# Patient Record
Sex: Male | Born: 2000 | Race: White | Hispanic: No | Marital: Single | Smoking: Current every day smoker
Health system: Southern US, Community
[De-identification: ages and names within clinical notes are randomized; demographics above are authoritative.]

---
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
## 2021-06-03 ENCOUNTER — Other Ambulatory Visit (INDEPENDENT_AMBULATORY_CARE_PROVIDER_SITE_OTHER): Payer: BC Managed Care – PPO

## 2021-06-03 ENCOUNTER — Encounter: Payer: Self-pay | Admitting: Family Medicine

## 2021-06-03 ENCOUNTER — Other Ambulatory Visit: Payer: Self-pay

## 2021-06-03 ENCOUNTER — Telehealth: Payer: BC Managed Care – PPO | Admitting: Family Medicine

## 2021-06-03 VITALS — Temp 100.3°F | Ht 74.0 in | Wt 155.0 lb

## 2021-06-03 DIAGNOSIS — R509 Fever, unspecified: Secondary | ICD-10-CM

## 2021-06-03 DIAGNOSIS — R1110 Vomiting, unspecified: Secondary | ICD-10-CM

## 2021-06-03 DIAGNOSIS — R197 Diarrhea, unspecified: Secondary | ICD-10-CM | POA: Diagnosis not present

## 2021-06-03 DIAGNOSIS — R630 Anorexia: Secondary | ICD-10-CM | POA: Diagnosis not present

## 2021-06-03 DIAGNOSIS — R109 Unspecified abdominal pain: Secondary | ICD-10-CM | POA: Diagnosis not present

## 2021-06-03 DIAGNOSIS — R519 Headache, unspecified: Secondary | ICD-10-CM

## 2021-06-03 LAB — POC COVID19 BINAXNOW: SARS Coronavirus 2 Ag: NEGATIVE

## 2021-06-03 LAB — POCT INFLUENZA A/B
Influenza A, POC: NEGATIVE
Influenza B, POC: NEGATIVE

## 2021-06-03 NOTE — Progress Notes (Signed)
Start time: 1:30 End time: 1:50  Virtual Visit via Video Note  I connected with Ronald Frey on 06/03/21 by a video enabled telemedicine application and verified that I am speaking with the correct person using two identifiers.  Location: Patient: home Provider: office   I discussed the limitations of evaluation and management by telemedicine and the availability of in person appointments. The patient expressed understanding and agreed to proceed.  History of Present Illness:  Chief Complaint  Patient presents with   Fever    VIRTUAL fever, HA, stomach, loss of appetite and diarrhea. Symptoms started Sunday night and worsened Monday.   12/4 he started with headache, thought it could be his sinuses.  He took 2 tylenol.  The next day he woke up with stomach cramps, fever of 101.1. He had diarrhea all that day, and vomited just once in the morning. Tues/Wed fever remained low grade, around 100.  Stomach cramps have improved, frequency of loose stools has decreased (2x/Tues, once today).  No mucus or blood in the stool. No further vomiting.  Some nausea intermittently.  Drinking fluids.  Cut back on eating due to decreased appetite.  Ongoing HA's, needing to take tylenol, which helps. Mouth is dry.  Denies runny nose, sore throat, postnasal drip, cough.  No sick contacts at home. +sick contacts at work ("flu"; no COVID that he is aware of) No travel, raw foods, camping, no recent ABX.  +vapes--trying to cut back  PMH, PSH, SH reviewed  No COVID or flu vaccines Had COVID 2x, last was 3-4 months ago.  Never treated with any medications  Meds: tylenol only   No Known Allergies  ROS: fever, HA and GI complaint per HPI. No URI symptoms, no urinary symptoms. No chest pain, shortness of breath, palpitations No rash. No blood or mucus in the stool    Observations/Objective:  Temp 100.3 F (37.9 C) (Tympanic)   Ht 6\' 2"  (1.88 m)   Wt 155 lb (70.3 kg)   BMI 19.90 kg/m    Well-appearing, pleasant male in no distress He is alert and oriented. Cranial nerves grossly intact. Normal speech Exam is limited due to virtual nature of the visit   Negative COVID and flu tests  Assessment and Plan:  Vomiting and diarrhea - suspect viral AGE. Reviewed supportive measures and red flags for concern.  Stomach cramps - overall improving   Fever, unspecified fever cause - negative COVID test. Poss related to fever, dehydration. Cont tylenol prn  Stay well hydrated Avoid dairy products for another 3-5 days. BRAT diet (bananas, rice, applesauce and toast), bland diet (avoid spicy, greasy fried foods). Taking probiotic daily may help. Return if you develop persistent fever, worsening abdominal pain, blood or mucus in the stool, recurrent vomiting, ongoing diarrhea, or any other concerns.  I hope you feel better soon!  Needs CPE with  Follow Up Instructions:    I discussed the assessment and treatment plan with the patient. The patient was provided an opportunity to ask questions and all were answered. The patient agreed with the plan and demonstrated an understanding of the instructions.   The patient was advised to call back or seek an in-person evaluation if the symptoms worsen or if the condition fails to improve as anticipated.  I spent 23 minutes dedicated to the care of this patient, including pre-visit review of records, face to face time, post-visit ordering of testing and documentation.    Vincenza Hews, MD

## 2021-06-03 NOTE — Patient Instructions (Signed)
Stay well hydrated Avoid dairy products for another 3-5 days. BRAT diet (bananas, rice, applesauce and toast), bland diet (avoid spicy, greasy fried foods). Taking probiotic daily may help. Return if you develop persistent fever, worsening abdominal pain, blood or mucus in the stool, recurrent vomiting, ongoing diarrhea, or any other concerns.  I hope you feel better soon!

## 2021-06-04 LAB — NOVEL CORONAVIRUS, NAA: SARS-CoV-2, NAA: NOT DETECTED

## 2021-11-09 ENCOUNTER — Ambulatory Visit (INDEPENDENT_AMBULATORY_CARE_PROVIDER_SITE_OTHER): Payer: BC Managed Care – PPO | Admitting: Medical

## 2021-11-09 VITALS — BP 110/70 | HR 57 | Temp 98.6°F | Wt 160.4 lb

## 2021-11-09 DIAGNOSIS — L089 Local infection of the skin and subcutaneous tissue, unspecified: Secondary | ICD-10-CM

## 2021-11-09 DIAGNOSIS — S80861A Insect bite (nonvenomous), right lower leg, initial encounter: Secondary | ICD-10-CM

## 2021-11-09 DIAGNOSIS — W57XXXA Bitten or stung by nonvenomous insect and other nonvenomous arthropods, initial encounter: Secondary | ICD-10-CM

## 2021-11-09 MED ORDER — CEPHALEXIN 500 MG PO CAPS: 500.0000 mg | ORAL_CAPSULE | Freq: Three times a day (TID) | ORAL | 0 refills | Status: DC

## 2021-11-09 NOTE — Progress Notes (Signed)
Subjective: ? Ronald Frey is a 21 y.o. male who presents for ?Chief Complaint  ?Patient presents with  ? bite  ?  Bite on right leg red and swollen.   ?   ?Here for localized redness and tendnerss of right lower leg.  Thinks he got bit by something in his sleep.  He was outside fishing 2 days ago, but just realized he had a red tender mark on his leg today.  No fever, no NVD, no chills.  No contacts with skin infection. No other aggravating or relieving factors.   ? ?No other c/o. ? ?The following portions of the patient's history were reviewed and updated as appropriate: allergies, current medications, past family history, past medical history, past social history, past surgical history and problem list. ? ?ROS ?Otherwise as in subjective above ? ?Objective: ?BP 110/70   Pulse (!) 57   Temp 98.6 ?F (37 ?C)   Wt 160 lb 6.4 oz (72.8 kg)   BMI 20.59 kg/m?  ? ?General appearance: alert, no distress, well developed, well nourished ?Skin: right lower leg medially with 2cm round area of erythema, with 2 small round lesions possible insect bite markings, otherwise slight warmth without fluctuance or induration ?Tender to touch at area above only. Rest of leg nontender and normal ROM ?No ext edema ?Legs neurovascularly intact ? ? ?Assessment: ?Encounter Diagnoses  ?Name Primary?  ? Skin infection Yes  ? Insect bite of right lower leg, initial encounter   ? ? ? ?Plan: ?Discussed symptoms and concerns.  Likely insect bite, but probably not black widow bite given lack of other symptoms. ? ?Discussed recommendations below ? ? ?Patient Instructions  ?Recommendations: ?Elevated the leg when possible over the next 3 days ?Use hot compresses 20 minutes at a time ?You can use Tylenol or Ibuprofen for pain every 6 hours as needed for the next few days ?Begin Keflex oral antibiotic x 7-10 days ?Keep the area clean with soap and water ?If much worse in the next 2-3 days, then come back for recheck ? ? ? ? ?Ronald Frey was seen  today for bite. ? ?Diagnoses and all orders for this visit: ? ?Skin infection ? ?Insect bite of right lower leg, initial encounter ? ? ? ?Follow up: prn ? ?

## 2021-11-09 NOTE — Patient Instructions (Signed)
Recommendations: ?Elevated the leg when possible over the next 3 days ?Use hot compresses 20 minutes at a time ?You can use Tylenol or Ibuprofen for pain every 6 hours as needed for the next few days ?Begin Keflex oral antibiotic x 7-10 days ?Keep the area clean with soap and water ?If much worse in the next 2-3 days, then come back for recheck ? ? ? ? ?

## 2021-11-10 ENCOUNTER — Other Ambulatory Visit: Payer: Self-pay | Admitting: Medical

## 2021-11-10 ENCOUNTER — Telehealth: Payer: Self-pay

## 2021-11-10 MED ORDER — SULFAMETHOXAZOLE-TRIMETHOPRIM 800-160 MG PO TABS: 1.0000 | ORAL_TABLET | Freq: Two times a day (BID) | ORAL | 0 refills | Status: DC

## 2021-11-10 MED ORDER — HYDROCODONE-ACETAMINOPHEN 10-325 MG PO TABS: 1.0000 | ORAL_TABLET | Freq: Four times a day (QID) | ORAL | 0 refills | Status: DC | PRN

## 2021-11-10 NOTE — Telephone Encounter (Signed)
Started antibiotics, pain was better this morning, but now shooting pain, can't hardly walk on it, redness worse, dark red part same, light red spreading all around leg, down back of calf.  Discussed with Vincenza Hews and he wants pt to continue antibiotic and adding another antibiotic and pain medication. Stay off leg, rest & elevate. Ok note for out of work. Warm compresses & elevate.  Told pt to watch for black or grey area in center if occurs call office to come in immediately and also come back in Thursday for recheck.  Pt informed of all this, Appt made, note for work.

## 2021-11-12 ENCOUNTER — Ambulatory Visit: Payer: BC Managed Care – PPO | Admitting: Medical

## 2021-11-12 VITALS — BP 110/70 | HR 61 | Temp 98.0°F | Wt 154.6 lb

## 2021-11-12 DIAGNOSIS — L02415 Cutaneous abscess of right lower limb: Secondary | ICD-10-CM | POA: Diagnosis not present

## 2021-11-12 NOTE — Progress Notes (Signed)
Subjective:  Ronald Frey is a 21 y.o. male who presents for Chief Complaint  Patient presents with   Insect Bite    Bite has gotten worse and raised more and swelling down leg and redness     Here for recheck.   I saw him on 11/09/21 for possible spider bite and skin infection.  Despite starting Keflex and Bactrim the area has gotten worse.  More swollen more tender.  No fever.  No drainage. No other aggravating or relieving factors.    No other c/o.  The following portions of the patient's history were reviewed and updated as appropriate: allergies, current medications, past family history, past medical history, past social history, past surgical history and problem list.  ROS Otherwise as in subjective above  Objective: BP 110/70   Pulse 61   Temp 98 F (36.7 C)   Wt 154 lb 9.6 oz (70.1 kg)   BMI 19.85 kg/m   General appearance: alert, no distress, well developed, well nourished Skin: right lower leg medially with 4cm round area of erythema, raised tender area with slight induration, possible fluctuance, slight warmth, central crusted scab No ext edema Legs neurovascularly intact   Assessment: Encounter Diagnosis  Name Primary?   Cutaneous abscess of right lower extremity Yes      Plan: Discussed examination findings, diagnosis, usual course of illness, and options for therapy discussed. After discussing recommendations, patient agrees to I&D, oral antibiotics.    Procedure Informed consent obtained.  The area was prepped in the usual manner and the skin overlying the abscess was anesthetized with 2.5cc of 1% lidocaine with epinephrine.  The area was sharply incised and approx 3ccs of purulent material was obtained.  Area was irrigated with high pressure saline. Packing was inserted. Wound was covered with sterile bandage.    Advised patient to complete the course of oral antibiotics, use warm compresses or heat applied to the area to promote drainage.  3 days from  now will be Sunday.  He will yank the packing out on Sunday.  Finish out course of antibiotics, await culture.  As long as it is improving daily then plan to follow-up on 7 to 10 days  However, if worse signs of infections as discussed (fever, chills, nausea, vomiting, worsening redness, worsening pain), then call or return immediately.    There are no Patient Instructions on file for this visit. [name] was seen today for insect bite.  Diagnoses and all orders for this visit:  Cutaneous abscess of right lower extremity -     WOUND CULTURE   Follow up: 7-10 days

## 2021-11-16 LAB — WOUND CULTURE

## 2021-12-28 ENCOUNTER — Encounter: Payer: Self-pay | Admitting: Physician Assistant

## 2021-12-28 ENCOUNTER — Ambulatory Visit: Payer: BC Managed Care – PPO | Admitting: Physician Assistant

## 2021-12-28 VITALS — BP 110/60 | HR 72 | Ht 74.0 in | Wt 152.2 lb

## 2021-12-28 DIAGNOSIS — L03818 Cellulitis of other sites: Secondary | ICD-10-CM

## 2021-12-28 DIAGNOSIS — Z22322 Carrier or suspected carrier of Methicillin resistant Staphylococcus aureus: Secondary | ICD-10-CM

## 2021-12-28 DIAGNOSIS — Z113 Encounter for screening for infections with a predominantly sexual mode of transmission: Secondary | ICD-10-CM

## 2021-12-28 MED ORDER — CEFDINIR 300 MG PO CAPS: 300.0000 mg | ORAL_CAPSULE | Freq: Two times a day (BID) | ORAL | 0 refills | Status: DC

## 2021-12-28 NOTE — Progress Notes (Signed)
Acute Office Visit  Subjective:    Patient ID: Ronald Frey, male    DOB: Oct 19, 2000, 21 y.o.   MRN: [id number]  Chief Complaint  Patient presents with   Acute Visit    Bump on penis that's painful. He has had it since Friday.    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 (Patient not taking: Reported on 12/28/2021) 30 capsule 0   HYDROcodone-acetaminophen (NORCO) 10-325 MG tablet Take 1 tablet by mouth every 6 (six) hours as needed. 1/2-1 tablet po prn q6 hours prn pain (Patient not taking: Reported on 12/28/2021) 15 tablet 0   sulfamethoxazole-trimethoprim (BACTRIM DS) 800-160 MG tablet Take 1 tablet by mouth 2 (two) times daily. (Patient not taking: Reported on 12/28/2021) 14 tablet 0   No facility-administered medications prior to visit.    No Known Allergies  Review of Systems  Constitutional:  Negative for activity change, chills, fatigue and fever.  HENT:  Negative for congestion, ear pain, hearing loss and voice change.   Eyes:  Negative for pain and redness.  Respiratory:  Negative for cough and shortness of breath.   Cardiovascular:  Negative for leg swelling.  Gastrointestinal:  Negative for constipation, diarrhea, nausea and vomiting.  Endocrine: Negative for polyuria.  Genitourinary:  Positive for genital sores. Negative for difficulty urinating, dysuria, flank pain, frequency, penile  discharge, penile swelling, scrotal swelling and testicular pain.  Musculoskeletal:  Negative for joint swelling and neck pain.  Skin:  Negative for rash.  Neurological:  Negative for dizziness.  Hematological:  Does not bruise/bleed easily.  Psychiatric/Behavioral:  Negative for agitation and behavioral problems.        Objective:    Physical Exam Vitals and nursing note reviewed. Exam conducted with a chaperone present.  Constitutional:      General: He is not in acute distress.    Appearance: Normal appearance.  HENT:     Head: Normocephalic and atraumatic.     Right Ear: External ear normal.     Left Ear: External ear normal.     Nose: No congestion.  Eyes:     Extraocular Movements: Extraocular movements intact.     Conjunctiva/sclera: Conjunctivae normal.     Pupils: Pupils are equal, round, and reactive to light.  Cardiovascular:     Rate and Rhythm: Normal rate and regular rhythm.     Pulses: Normal pulses.     Heart sounds: Normal heart sounds.  Pulmonary:     Effort: Pulmonary effort is normal.     Breath sounds: Normal breath sounds. No wheezing.  Abdominal:     General: Bowel sounds are normal.     Palpations: Abdomen is soft.  Genitourinary:      Comments: Pustule with green/yellow discharge with surrounding erythema, no edema, no contusion Musculoskeletal:        General: Normal range of motion.     Cervical  back: Normal range of motion and neck supple.     Right lower leg: No edema.     Left lower leg: No edema.  Skin:    General: Skin is warm and dry.     Findings: No rash.  Neurological:     Mental Status: He is alert and oriented to person, place, and time.     Gait: Gait normal.  Psychiatric:        Mood and Affect: Mood normal.        Behavior: Behavior normal.     BP 110/60   Pulse 72   Wt 152 lb 3.2 oz (69 kg)   SpO2 97%   BMI 19.54 kg/m   Wt Readings from Last 3 Encounters:  12/28/21 152 lb 3.2 oz (69 kg)  11/12/21 154 lb 9.6 oz  (70.1 kg)  11/09/21 160 lb 6.4 oz (72.8 kg)    Results for orders placed or performed in visit on 11/12/21  WOUND CULTURE   Specimen: Wound   LG  Result Value Ref Range   Gram Stain Result Final report    Organism ID, Bacteria Comment    Organism ID, Bacteria Comment    Aerobic Bacterial Culture Final report (A)    Organism ID, Bacteria Comment (A)    Antimicrobial Susceptibility Comment        Assessment & Plan:  1. Cellulitis of other specified site - WOUND CULTURE - Herpes simplex virus culture - Chlamydia/GC NAA, Confirmation  2. 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   - declines labs to check for HIV, HEP C, syphilis   Meds ordered this encounter  Medications   cefdinir (OMNICEF) 300 MG capsule    Sig: Take 1 capsule (300 mg total) by mouth 2 (two) times daily.    Dispense:  20 capsule    Refill:  0    Order Specific Question:   Supervising Provider    Answer:   Ronnald Nian [[id number]]    Return for Return as Already Scheduled.  Jake Shark, PA-C

## 2021-12-28 NOTE — Patient Instructions (Signed)
Rest, increase clear fluids, OTC Tylenol (acetamenophen) for body aches, headaches, fever, chills as needed.  ? ?

## 2021-12-30 LAB — CHLAMYDIA/GC NAA, CONFIRMATION
Chlamydia trachomatis, NAA: NEGATIVE
Neisseria gonorrhoeae, NAA: NEGATIVE

## 2021-12-31 LAB — WOUND CULTURE: Organism ID, Bacteria: NONE SEEN

## 2021-12-31 LAB — HERPES SIMPLEX VIRUS CULTURE

## 2021-12-31 MED ORDER — SULFAMETHOXAZOLE-TRIMETHOPRIM 800-160 MG PO TABS: 1.0000 | ORAL_TABLET | Freq: Two times a day (BID) | ORAL | 0 refills | Status: AC

## 2021-12-31 NOTE — Addendum Note (Signed)
Addended by: Burnard Hawthorne on: 12/31/2021 09:02 AM   Modules accepted: Orders

## 2021-12-31 NOTE — Progress Notes (Signed)
Please call patient to let him know that he DOES NOT have any STIs: Gonorrhea, Chlamydia, and Herpes all NEGATIVE  He does have a skin infection, cellulitis, caused by the MRSA bacteria. I will order 1 more round of antibiotics to make sure that the infection completely heals, Bactrim DS, 1 by mouth every 12 hours with food x 14 days.  Methicillin-resistant Staphylococcus aureus (MRSA) infection is caused by bacteria called Staphylococcus aureus, or staph. MRSA infection can be hard to treat.  This condition is caused by staph bacteria. Illness may develop after exposure to the bacteria through:  Skin-to-skin contact with someone who is infected with MRSA.  Touching surfaces that have the bacteria on them.  Having a procedure or using equipment that allows MRSA to enter the body.  Having MRSA that lives on your skin and then enters your body through:  A cut or scratch.  A surgery or procedure.  The use of a medical device.

## 2022-02-26 ENCOUNTER — Ambulatory Visit: Payer: 59 | Admitting: Medical

## 2022-02-26 VITALS — BP 106/72 | HR 78 | Temp 97.9°F | Ht 73.0 in | Wt 146.2 lb

## 2022-02-26 DIAGNOSIS — F419 Anxiety disorder, unspecified: Secondary | ICD-10-CM | POA: Diagnosis not present

## 2022-02-26 DIAGNOSIS — R454 Irritability and anger: Secondary | ICD-10-CM

## 2022-02-26 NOTE — Progress Notes (Signed)
Subjective:  Ronald Frey is a 21 y.o. male who presents for Chief Complaint  Patient presents with   Anxiety    And stress for about a month      Here for concerns with anxiety.  Shaking, feeling anxious on a high level.   Always angry.  Lots of things upset him.  His mother thinks he is on drugs the way he is acting.  Losing weight dur to anxiety and not eating as much.    Lives with mother.   Recently lost his job.  Didn't like the way the boss handled a situation and put in his 2 week notice.   Searching for something else now.  Had been at that job 6 mo, this was [employer].   Has worked at [employer], [employer], and [employer] prior.  not in school.   He gets frustrated with video games.  Like to do games with there is nothing new so he gets bored with them.  He gets frustrated with this.  He gets frustrated that his mom asks him to do several things although he is already done numerous things she has asked.  Start  Says his mom is his best friend.    He notes a time in high school where he saw a therapist.   After some therapy things improved.    No prior medication for mental health issues.   No prior hospitalization for mental health issues.    No known mental health issues in family.    Not attending church currently.  He has been in a relationship for 3 years, but its not doing well.  Arguing a lot more than anything.  Not exercising.  Doesn't watch a lot of news.   No other aggravating or relieving factors.    No other c/o.  No past medical history on file.  No current outpatient medications on file prior to visit.   No current facility-administered medications on file prior to visit.   The following portions of the patient's history were reviewed and updated as appropriate: allergies, current medications, past family history, past medical history, past social history, past surgical history and problem list.  ROS Otherwise as in subjective  above  Objective: BP 106/72   Pulse 78   Temp 97.9 F (36.6 C)   Ht 6\' 1"  (1.854 m)   Wt 146 lb 3.2 oz (66.3 kg)   SpO2 97%   BMI 19.29 kg/m   Anxiety  Symptoms: No chest pain Yes difficulty concentrating  No dizziness Yes fatigue  Yes feelings of losing control Yes insomnia  Yes irritable Yes palpitations  Yes panic attacks Yes racing thoughts  No shortness of breath Yes sweating  Yes tremors/shakes    GAD-7 Results    02/26/2022    8:46 AM  GAD-7 Generalized Anxiety Disorder Screening Tool  1. Feeling Nervous, Anxious, or on Edge 3  2. Not Being Able to Stop or Control Worrying 3  3. Worrying Too Much About Different Things 3  4. Trouble Relaxing 3  5. Being So Restless it's Hard To Sit Still 3  6. Becoming Easily Annoyed or Irritable 3  7. Feeling Afraid As If Something Awful Might Happen 2  Total GAD-7 Score 20  Difficulty At Work, Home, or Getting  Along With Others? Extremely difficult    PHQ-9 Scores    02/26/2022    8:41 AM 11/09/2021    1:34 PM  PHQ9 SCORE ONLY  PHQ-9 Total Score 16 0  General appearance: alert, no distress, well developed, well nourished Pleasant, good eye contact, answers questions appropriately   Assessment: Encounter Diagnoses  Name Primary?   Anxiety Yes   Anger      Plan: We discussed his anxiety, his anger.  We discussed trying to look at the positive.  We discussed the positive that he is healthy, he has a lot of opportunity,  he does not have a lot of obligations, so he really has a lot of ability to turn things around and make things the way he wants them to be.  I encouraged him to search for a job  he would be interested in. We discussed exercise as a way to deal with stress.  We discussed talk with counselor and try to work on some specific things such as nurturing his relationships with his mother and girlfriend, trying to find purpose in life.  I encouraged him to come to my chart or there is a young adult group that  would work well with him.  We discussed ways to deal with stress and anxiety. I recommend regular exercise such as 30 minutes or more most days of the week such as walking running and bicycling I recommend taking some time to meditate or pray daily to help slow racing thoughts. I recommend working on relaxation techniques such as deep breathing exercises in a comfortable position relaxing your body.  There are free Apps on the smart phone for this for example Consider getting a massage Journal or use diary to express your ideas on paper to cope with anxiety and stress Work on time management, use a calendar or plan out things to avoid stressing about things. Find ways to utilize your time to include exercise and personal "me" time. Some people use aromatherapy such as lavender to relax Some people use herbal teas to help calm their mood Spend some time with animals or your pet if you have one Consider seeing a counselor to help deal with anxiety and work on specific techniques   Ronald Frey was seen today for anxiety.  Diagnoses and all orders for this visit:  Anxiety -     Ambulatory referral to Psychology  Anger -     Ambulatory referral to Psychology    Follow up: 6 weeks

## 2022-02-26 NOTE — Patient Instructions (Addendum)
We discussed ways to deal with stress and anxiety. I recommend regular exercise such as 30 minutes or more most days of the week such as walking running and bicycling I recommend taking some time to meditate or pray daily to help slow racing thoughts. I recommend working on relaxation techniques such as deep breathing exercises in a comfortable position relaxing your body.  There are free Apps on the smart phone for this for example Consider getting a massage Journal or use diary to express your ideas on paper to cope with anxiety and stress Work on time management, use a calendar or plan out things to avoid stressing about things. Find ways to utilize your time to include exercise and personal "me" time. Some people use aromatherapy such as lavender to relax Some people use herbal teas to help calm their mood Spend some time with animals or your pet if you have one Consider seeing a counselor to help deal with anxiety and work on specific techniques  RESOURCES in [place], [place]  If you are experiencing a mental health crisis or an emergency, please call 911 or go to the nearest emergency department.  [facility]   [phone] [facility]  [phone] [facility]   [phone]  Suicide Hotline 1-800-Suicide 405-513-2615)  National Suicide Prevention Lifeline 1-800-273-TALK  (613)288-7024)  Domestic Violence, Rape/Crisis - [facility] [place] [phone]  [employer] Violence Hotline 1-800-799-SAFE (215)511-1781)  To report Child or Elder Abuse, please call: [facility] Police Department  [phone] [facility] Department  [phone]  Teen Crisis line ([phone])288-6887 or [phone]     [facility] Crisis Line and Main phone number ([phone]  Behavioral [facility] Urgent Care  [phone]  [facility] Out[facility] [phone]  Adult Crisis  Center [phone]   Dr. Kearney Hard [place] [phone] [address] [place], [place] [address]   [facility] [address] [place], [place] [address] [phone]   [facility] [phone] [address], [place], [place] [address]   [facility] [phone] [address], [place], [place] [address]   [facility] [phone] office [address] [place], [place] [address] Crisis services, Family support, in home therapy, treatment for Anxiety, PTSD, Sexual Assault, Substance Abuse, Financial/Credit Counseling, Variety of other services

## 2022-04-27 ENCOUNTER — Ambulatory Visit (HOSPITAL_BASED_OUTPATIENT_CLINIC_OR_DEPARTMENT_OTHER): Payer: 59 | Admitting: Psychiatry

## 2022-04-27 VITALS — BP 105/62 | HR 49

## 2022-04-27 DIAGNOSIS — F321 Major depressive disorder, single episode, moderate: Secondary | ICD-10-CM

## 2022-04-27 DIAGNOSIS — F1210 Cannabis abuse, uncomplicated: Secondary | ICD-10-CM | POA: Diagnosis not present

## 2022-04-27 MED ORDER — HYDROXYZINE HCL 25 MG PO TABS: 25.0000 mg | ORAL_TABLET | Freq: Three times a day (TID) | ORAL | 1 refills | Status: DC | PRN

## 2022-04-27 MED ORDER — FLUOXETINE HCL 10 MG PO CAPS: 10.0000 mg | ORAL_CAPSULE | Freq: Every day | ORAL | 1 refills | Status: DC

## 2022-04-27 NOTE — Progress Notes (Cosign Needed)
Psychiatric Initial Adult Assessment   Patient Identification: Ronald Frey MRN:  [id number] Date of Evaluation:  04/28/2022 Referral Source: [facility] Chief Complaint:   Chief Complaint  Patient presents with   Anxiety   Depression   Visit Diagnosis:    ICD-10-CM   1. Current moderate episode of major depressive disorder without prior episode (HCC)  F32.1 FLUoxetine (PROZAC) 10 MG capsule    hydrOXYzine (ATARAX) 25 MG tablet    DISCONTINUED: hydrOXYzine (ATARAX) 25 MG tablet    2. Cannabis abuse  F12.10       History of Present Illness: Patient is a 21 year old male with past psychiatric history of acute stress disorder presented to [facility] outpatient clinic for depression and anxiety.  Patient states he has been feeling depressed, and not able to sleep or eat lately.  He reports that he has been feeling very irritable lately and often lashes out on people and yells at them.  He is getting into more fights with people everywhere.  He reports that he wants to be calm and happy.  He denies any triggers but reports some stressors i pertaining to unemployment and currently having issues with her girlfriend.  He reports that he was living with his girlfriend but due to issues with his girlfriend they are both living with his mom.  He endorses depressed mood x>6 months, poor appetite, poor sleep, anhedonia, fatigue, low energy, decreased concentration, and poor memory.  He denies feeling hopeless and helpless.  He reports that his weight fluctuates and he has not noted any major changes in his weight.  He reports that 1-1/2-year ago when he was using ecstasy and he had episodes when he felt overjoyed, happy and did not sleep up to 5 days and was hallucinating.  He denies any manic symptoms or episode including pressured speech, decreased need for sleep, hypersexuality, increased spending, racing thoughts, flight of ideas and grandiosity in the absence of drugs.  He reports feeling  irritable, and  angry. He reports increase anxiety with shakiness and occasional panic attacks.  Currently, He denies active or passive Suicidal ideations, Homicidal ideations, auditory and visual hallucinations. He  reports paranoia because of his past experiences with gangs.  He feels that people from those gangs are following him but denies any nightmares and flashbacks related to that. Discussed starting Prozac for depression, anxiety and irritability.  Discussed risks and benefits and side effects. Patient agrees with the plan.  Past Psychiatric Hx:  Previous Psych Diagnoses: Acute stress disorder Prior inpatient treatment: Denies Current meds: None Psychotherapy hx: Got some therapy when he was young Previous suicidal attempts: Denies Previous medication trials: Denies Current therapist: None  Substance Abuse Hx: Alcohol: Drinks on the weekends sometimes.  1/5 of liquor with couple of beers Tobacco:Denies Illicit drugs-smokes marijuana every night.  Used to use ecstasy in the past Rehab hx: Denies Seizures, DUI's, DT's- Denies  Past Medical History: Medical Diagnoses: Denies Home AS:[id number] H/o seizures: Denies Allergies:Denies PCP: None   Family Psych History: Psych: Mom-takes Xanax Dad-has anger issues SA/HA: Long time ago-grandfather side  Social History: Marital Status: Single Children: None Employment: Unemployed Education: Completed high school. Housing: Lives with mom and girlfriend Guns: Denies Legal: Denies   Associated Signs/Symptoms: Depression Symptoms:  depressed mood, anhedonia, insomnia, fatigue, difficulty concentrating, impaired memory, anxiety, loss of energy/fatigue, decreased appetite, (Hypo) Manic Symptoms:  Irritable Mood, Anxiety Symptoms:  Excessive Worry, Psychotic Symptoms:   denies PTSD Symptoms: Had a traumatic exposure:  see HPI Re-experiencing:  None  Past Psychiatric History: See HPI  Previous Psychotropic Medications: No    Substance Abuse History in the last 12 months:  Yes.    Consequences of Substance Abuse: Medical Consequences:  Had episodes with hallucinations, high energy, decreased need for sleep with ecstasy . Mood symptoms. Past Medical History: No past medical history on file. No past surgical history on file.  Family Psychiatric History: See HPI  Family History: No family history on file.  Social History:   Social History   Socioeconomic History   Marital status: Single    Spouse name: Not on file   Number of children: Not on file   Years of education: Not on file   Highest education level: Not on file  Occupational History   Not on file  Tobacco Use   Smoking status: Every Day    Types: E-cigarettes   Smokeless tobacco: Never  Substance and Sexual Activity   Alcohol use: No   Drug use: No   Sexual activity: Not on file  Other Topics Concern   Not on file  Social History Narrative   Not on file   Social Determinants of Health   Financial Resource Strain: Not on file  Food Insecurity: Not on file  Transportation Needs: Not on file  Physical Activity: Not on file  Stress: Not on file  Social Connections: Not on file    Additional Social History: See HPI  Allergies:  No Known Allergies  Metabolic Disorder Labs: No results found for: "HGBA1C", "MPG" No results found for: "PROLACTIN" No results found for: "CHOL", "TRIG", "HDL", "CHOLHDL", "VLDL", "LDLCALC" Lab Results  Component Value Date   TSH 1.650 07/20/2018    Therapeutic Level Labs: No results found for: "LITHIUM" No results found for: "CBMZ" No results found for: "VALPROATE"  Current Medications: Current Outpatient Medications  Medication Sig Dispense Refill   FLUoxetine (PROZAC) 10 MG capsule Take 1 capsule (10 mg total) by mouth daily. 30 capsule 1   hydrOXYzine (ATARAX) 25 MG tablet Take 1 tablet (25 mg total) by mouth 3 (three) times daily as needed for anxiety (insomnia). 90 tablet 1   No  current facility-administered medications for this visit.    Musculoskeletal: Strength & Muscle Tone: within normal limits Gait & Station: normal Patient leans: N/A  Psychiatric Specialty Exam: Review of Systems  Blood pressure 105/62, pulse (!) 49, SpO2 99 %.There is no height or weight on file to calculate BMI.  General Appearance: Casual  Eye Contact:  Good  Speech:  Clear and Coherent and Normal Rate  Volume:  Normal  Mood:  Anxious and Depressed  Affect:  Constricted  Thought Process:  Coherent  Orientation:  Full (Time, Place, and Person)  Thought Content:  Logical  Suicidal Thoughts:  No  Homicidal Thoughts:  No  Memory:  Immediate;   Good Recent;   Good  Judgement:  Fair  Insight:  Good  Psychomotor Activity:  Normal  Concentration:  Concentration: Good and Attention Span: Good  Recall:  Good  Fund of Knowledge:Good  Language: Good  Akathisia:  No  Handed:  Right  AIMS (if indicated):  not done  Assets:  Communication Skills Desire for Improvement Financial Resources/Insurance Housing Social Support  ADL's:  Intact  Cognition: WNL  Sleep:  Poor   Screenings: GAD-7    Flowsheet Row Office Visit from 02/26/2022 in [facility]  Total GAD-7 Score 20      PHQ2-9    [place] Office Visit from 04/27/2022  [facility] Office Visit from 02/26/2022 in [facility] Office Visit from 11/09/2021 in [facility]  PHQ-2 Total Score 3 2 0  PHQ-9 Total Score 14 16 --       Assessment and Plan: Patient is a 21 year old male with past psychiatric history of acute stress disorder presented to [facility] outpatient clinic for depression and anxiety.  Patient reports neurovegetative symptoms of depression.  We will start Prozac to help with depression, anxiety, and irritability.  Will start hydroxyzine for anxiety and insomnia.  PHQ 9 done with patient scored at 14.  MDD, single episode,  moderate -Start Prozac 10 mg daily for depression and anxiety.  Discussed risk and benefits and patient agrees with medication trial.  30-day prescription sent to patient pharmacy. -Start hydroxyzine 25 mg 3 times daily as needed for anxiety and insomnia.  30-day prescription sent to patient's pharmacy -Recommend starting therapy.   Cannabis abuse  -Recommend Cessation.   Follow-up in 4 weeks.  Collaboration of Care: Other Dr. Mercy Riding  Patient/Guardian was advised Release of Information must be obtained prior to any record release in order to collaborate their care with an outside provider. Patient/Guardian was advised if they have not already done so to contact the registration department to sign all necessary forms in order for [place] to release information regarding their care.   Consent: Patient/Guardian gives verbal consent for treatment and assignment of benefits for services provided during this visit. Patient/Guardian expressed understanding and agreed to proceed.   Karsten Ro, MD 11/1/202312:12 PM

## 2022-04-30 ENCOUNTER — Encounter (HOSPITAL_COMMUNITY): Payer: Self-pay | Admitting: Psychiatry

## 2022-05-26 ENCOUNTER — Ambulatory Visit (HOSPITAL_BASED_OUTPATIENT_CLINIC_OR_DEPARTMENT_OTHER): Payer: 59 | Admitting: Psychiatry

## 2022-05-26 VITALS — BP 107/62 | HR 86

## 2022-05-26 DIAGNOSIS — F1210 Cannabis abuse, uncomplicated: Secondary | ICD-10-CM | POA: Diagnosis not present

## 2022-05-26 DIAGNOSIS — F321 Major depressive disorder, single episode, moderate: Secondary | ICD-10-CM

## 2022-05-26 DIAGNOSIS — Z789 Other specified health status: Secondary | ICD-10-CM

## 2022-05-26 MED ORDER — HYDROXYZINE HCL 25 MG PO TABS: 25.0000 mg | ORAL_TABLET | Freq: Three times a day (TID) | ORAL | 1 refills | Status: AC | PRN

## 2022-05-26 MED ORDER — FLUOXETINE HCL 10 MG PO CAPS: 10.0000 mg | ORAL_CAPSULE | Freq: Every day | ORAL | 1 refills | Status: AC
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
## 2022-06-03 ENCOUNTER — Ambulatory Visit: Payer: 59 | Admitting: Medical

## 2022-06-03 VITALS — BP 120/80 | HR 65 | Wt 155.0 lb

## 2022-06-03 DIAGNOSIS — S63621A Sprain of interphalangeal joint of right thumb, initial encounter: Secondary | ICD-10-CM | POA: Diagnosis not present

## 2022-06-03 NOTE — Patient Instructions (Addendum)
Your symptoms and exam suggests sprain strain injury of right thumb extensor tendons  Recommendations: Rest the thumb for 1-2 weeks Use a thumb spica splint similar to picture below x 10-14 days.   You should be able to get this at local pharmacy or [facility] over the counter ibuprofen 200mg , 3 tablets /600mg  2-3 times daily for the next week Continue some cool therapy with ice pack 20 minutes at a time, 2-3 times daily for the next 4 days Symptoms should gradually improved After 10-14 days, either follow up or try going without splint and doing some finger exercises If not seemingly back to normal in 2 weeks, then recheck

## 2022-06-03 NOTE — Progress Notes (Signed)
Subjective:  Ronald Frey is a 21 y.o. male who presents for Chief Complaint  Patient presents with   Thumb pain    Patient complains of right thumb pain x 2 weeks. He thinks he may have injured his thumb while trying to catch himself from falling.      Here for thumb injury.  He fell in his house 2 weeks ago, tripping.  He landed forward with outstretched hands but mainly landed more against the right thumb.  He had pain about 15 minutes later and has had some ongoing discomfort for the last 2 weeks.  No specific bruising or discoloration.  No numbness or tingling.  Because he is still having some pain he went to get this checked out.  He has been using some ice therapy and ibuprofen.  He is right-handed.  No other aggravating or relieving factors.    No other c/o.  The following portions of the patient's history were reviewed and updated as appropriate: allergies, current medications, past family history, past medical history, past social history, past surgical history and problem list.  ROS Otherwise as in subjective above  Objective: BP 120/80   Pulse 65   Wt 155 lb (70.3 kg)   SpO2 99% Comment: room air  BMI 20.45 kg/m   General appearance: alert, no distress, well developed, well nourished Right thumb not particularly tender to palpitation, there is some pain in the right thumb proximal phalanx with extension of the thumb of both DIP and MCP but no other pain or tenderness with any other range of motion.  No thumb laxity, lateral motion and flexion of the thumb is normal, MCP range of motion normal without pain.  No obvious bruising or swelling or discoloration.  Rest the hand and wrist and fingers unremarkable There is some slight weakness with the extension of the right thumb but otherwise hand and thumb is neurovascularly intact    Assessment: Encounter Diagnosis  Name Primary?   Sprain of interphalangeal joint of right thumb, initial encounter Yes      Plan: Symptoms and exam suggest sprain strain injury of the right thumb extensor tendons.  We discussed the following recommendations.  He declines x-ray today.  I do not suspect fracture but I did offer x-ray to further evaluate.  Patient Instructions  Your symptoms and exam suggests sprain strain injury of right thumb extensor tendons  Recommendations: Rest the thumb for 1-2 weeks Use a thumb spica splint similar to picture below x 10-14 days.   You should be able to get this at local pharmacy or [facility] over the counter ibuprofen 200mg , 3 tablets /600mg  2-3 times daily for the next week Continue some cool therapy with ice pack 20 minutes at a time, 2-3 times daily for the next 4 days Symptoms should gradually improved After 10-14 days, either follow up or try going without splint and doing some finger exercises If not seemingly back to normal in 2 weeks, then recheck        Ronald Frey was seen today for thumb pain.  Diagnoses and all orders for this visit:  Sprain of interphalangeal joint of right thumb, initial encounter    Follow up: 2wk

## 2022-07-05 ENCOUNTER — Ambulatory Visit: Payer: 59 | Admitting: Medical

## 2022-07-05 ENCOUNTER — Encounter: Payer: Self-pay | Admitting: Medical

## 2022-07-05 VITALS — BP 122/70 | HR 65 | Temp 98.3°F | Resp 14 | Wt 167.0 lb

## 2022-07-05 DIAGNOSIS — H6690 Otitis media, unspecified, unspecified ear: Secondary | ICD-10-CM

## 2022-07-05 DIAGNOSIS — H6122 Impacted cerumen, left ear: Secondary | ICD-10-CM | POA: Diagnosis not present

## 2022-07-05 DIAGNOSIS — H9202 Otalgia, left ear: Secondary | ICD-10-CM

## 2022-07-05 MED ORDER — AMOXICILLIN 875 MG PO TABS: 875.0000 mg | ORAL_TABLET | Freq: Two times a day (BID) | ORAL | 0 refills | Status: AC

## 2022-07-05 NOTE — Progress Notes (Signed)
Subjective:  Ronald Frey is a 22 y.o. male who presents for Chief Complaint  Patient presents with   Ear Pain    Complains of left ear pain x 1 week. He  thinks he may have gotten water in his ear while taking a shower. Described pain quality as a sharp pain. Has tried putting peroxide in his ears.      Here for ear pressure/pain left ear x 1 week.  Feels like water down in ear.  But no illness.  No congestion, no cough, no fever, no body aches or chills.   Put some peroxide in the ear, bubble up but no change in symptoms.  Otherwise in usual state of health.  No other aggravating or relieving factors.    No other c/o.  The following portions of the patient's history were reviewed and updated as appropriate: allergies, current medications, past family history, past medical history, past social history, past surgical history and problem list.  ROS Otherwise as in subjective above  Objective: BP 122/70   Pulse 65   Temp 98.3 F (36.8 C) (Tympanic)   Resp 14   Wt 167 lb (75.8 kg)   SpO2 98% Comment: room air  BMI 22.03 kg/m   General appearance: alert, no distress, well developed, well nourished HEENT: normocephalic, sclerae anicteric, conjunctiva pink and moist, left ear canal with impacted cerumen, right ear canal with some dry cerumen, TM flat, nares patent, no discharge or erythema, pharynx normal Oral cavity: MMM, no lesions Neck: supple, no lymphadenopathy, no thyromegaly, no masses    Assessment: Encounter Diagnoses  Name Primary?   Left ear pain Yes   Impacted cerumen of left ear    Acute otitis media, unspecified otitis media type      Plan: Discussed initial findings.    Discussed risk/benefits of procedure and patient agrees to procedure. Successfully used warm water lavage to remove impacted cerumen from left ear canal. Patient tolerated procedure well. Advised they avoid using any cotton swabs or other devices to clean the ear canals.  Use basic hygiene as  discussed.    Both TMs abnormal appearing, begin amoxicillin, OTC decongestant, good water intake.  Recheck in 2 weeks, sooner prn  Ronald Frey was seen today for ear pain.  Diagnoses and all orders for this visit:  Left ear pain  Impacted cerumen of left ear  Acute otitis media, unspecified otitis media type  Other orders -     amoxicillin (AMOXIL) 875 MG tablet; Take 1 tablet (875 mg total) by mouth 2 (two) times daily for 10 days.    Follow up: prn

## 2022-07-21 ENCOUNTER — Ambulatory Visit (INDEPENDENT_AMBULATORY_CARE_PROVIDER_SITE_OTHER): Payer: 59 | Admitting: Medical

## 2022-07-21 VITALS — BP 110/70 | HR 57 | Temp 98.3°F | Wt 167.8 lb

## 2022-07-21 DIAGNOSIS — H6122 Impacted cerumen, left ear: Secondary | ICD-10-CM

## 2022-07-21 DIAGNOSIS — H938X2 Other specified disorders of left ear: Secondary | ICD-10-CM

## 2022-07-21 NOTE — Progress Notes (Signed)
Feels like left ear is still having pulse or pressure    Subjective:  Ronald Frey is a 22 y.o. male who presents for Chief Complaint  Patient presents with   fluid in ear    Fluid in ear x over a month- left ear     Here for follow-up.  I saw him a few weeks ago for ear infection and impacted cerumen.  He still feels some fluid like pressure in the left ear.  Wants to recheck on this.  No other symptoms.  No sore throat, cough, congestion.  Feels fine otherwise.  No other aggravating or relieving factors.    No other c/o.  The following portions of the patient's history were reviewed and updated as appropriate: allergies, current medications, past family history, past medical history, past social history, past surgical history and problem list.  ROS Otherwise as in subjective above  Objective: BP 110/70   Pulse (!) 57   Temp 98.3 F (36.8 C)   Wt 167 lb 12.8 oz (76.1 kg)   BMI 22.14 kg/m   General appearance: alert, no distress, well developed, well nourished Left ear canal with some mild cerumen buildup but TM pearly normal-appearing, right TM and canal normal    Assessment: Encounter Diagnoses  Name Primary?   Ear pressure, left Yes   Impacted cerumen of left ear      Plan: We placed a few drops of earwax removal in the left ear to help with limit the wax that is then.  Discussed the following instructions.  He declines ear lavage today.  Reassured that the TMs look normal today  Patient Instructions  For ear wax, you can use either over the counter Debrox brand drops every 1-2 weeks, or you can use a  1:1 mixture of rubbing alcohol and white vinegar (half white vinegar, half rubbing alcohol).  Put a few drops in each ear 2-3 times a week for a few weeks to help dissolve some of the wax.  If this doesn't seem successful, return for recheck.        Ronald Frey was seen today for fluid in ear.  Diagnoses and all orders for this visit:  Ear pressure,  left  Impacted cerumen of left ear    Follow up: prn

## 2022-07-21 NOTE — Patient Instructions (Addendum)
For ear wax, you can use either over the counter Debrox brand drops every 1-2 weeks, or you can use a  1:1 mixture of rubbing alcohol and white vinegar (half white vinegar, half rubbing alcohol).  Put a few drops in each ear 2-3 times a week for a few weeks to help dissolve some of the wax.  If this doesn't seem successful, return for recheck.

## 2022-07-29 ENCOUNTER — Ambulatory Visit (HOSPITAL_COMMUNITY): Payer: 59 | Admitting: Psychiatry

## 2022-12-29 ENCOUNTER — Telehealth: Payer: 59 | Admitting: Medical

## 2022-12-29 ENCOUNTER — Encounter: Payer: Self-pay | Admitting: Medical

## 2022-12-29 VITALS — BP 120/80 | HR 77 | Temp 99.9°F | Resp 16 | Wt 157.0 lb

## 2022-12-29 DIAGNOSIS — R112 Nausea with vomiting, unspecified: Secondary | ICD-10-CM

## 2022-12-29 DIAGNOSIS — J029 Acute pharyngitis, unspecified: Secondary | ICD-10-CM

## 2022-12-29 DIAGNOSIS — R509 Fever, unspecified: Secondary | ICD-10-CM

## 2022-12-29 LAB — POCT MONO (EPSTEIN BARR VIRUS): Mono, POC: NEGATIVE

## 2022-12-29 LAB — POCT RAPID STREP A (OFFICE): Rapid Strep A Screen: NEGATIVE

## 2022-12-29 MED ORDER — ONDANSETRON 4 MG PO TBDP: 4.0000 mg | ORAL_TABLET | Freq: Three times a day (TID) | ORAL | 0 refills | Status: AC | PRN

## 2022-12-29 NOTE — Progress Notes (Signed)
Subjective:     Patient ID: Ronald Frey, male   DOB: [birth year], 22 y.o.   MRN: [id number]  This visit type was conducted due to national recommendations for restrictions regarding the COVID-19 Pandemic (e.g. social distancing) in an effort to limit this patient's exposure and mitigate transmission in our community.  Due to their co-morbid illnesses, this patient is at least at moderate risk for complications without adequate follow up.  This format is felt to be most appropriate for this patient at this time.    Documentation for virtual audio and video telecommunications through [place] encounter:  The patient was located at home. The provider was located in the office. The patient did consent to this visit and is aware of possible charges through their insurance for this visit.  The other persons participating in this telemedicine service were none. Time spent on call was 20 minutes and in review of previous records 20 minutes total.  This virtual service is not related to other E/M service within previous 7 days.   HPI Chief Complaint  Patient presents with   Sore Throat    Complains of sore throat x 2 days.  Also reports chills, headache, sweats and body aches. Fever of 101 yesterday.    Virtual for sore throat, headache, aches, chills, x 2 days.   Only occasional cough.   Some nausea, but no vomiting, some + loose stool.  Fever up to 101.  Some runny nose.  No sick contacts, but has been working around some kids.  No wheezing, no shortness of breath.  No neck pain or stiffness.  Did a home covid test yesterday that was negative.  He notes in the past that it is not uncommon for him to get nauseated with viral illness.  No other aggravating or relieving factors. No other complaint.  No past medical history on file. Current Outpatient Medications on File Prior to Visit  Medication Sig Dispense Refill   FLUoxetine (PROZAC) 10 MG capsule Take 1 capsule (10 mg total) by mouth  daily. 30 capsule 1   hydrOXYzine (ATARAX) 25 MG tablet Take 1 tablet (25 mg total) by mouth 3 (three) times daily as needed for anxiety (insomnia). 90 tablet 1   No current facility-administered medications on file prior to visit.    Review of Systems As in subjective    Objective:   Physical Exam Due to coronavirus pandemic stay at home measures, patient visit was virtual and they were not examined in person.   BP 120/80   Pulse 77   Temp 99.9 F (37.7 C) (Oral)   Resp 16   Wt 157 lb (71.2 kg)   SpO2 97% Comment: room air  BMI 20.71 kg/m   Wt Readings from Last 3 Encounters:  12/29/22 157 lb (71.2 kg)  07/21/22 167 lb 12.8 oz (76.1 kg)  07/05/22 167 lb (75.8 kg)   Gen: Well-developed, nourished acute distress, somewhat ill-appearing No labored breathing or wheezing HEENT: Left TM with slight erythema, right TM normal, nares with swollen turbinates on the left, some clear discharge on the left, erythema bilateral nares, pharynx with some mild erythema, no exudate Neck: Small shotty tender anterior lymph nodes, otherwise supple, nontender, no mass Lungs clear  Of note, vomited once in the room after initial triage      Assessment:     Encounter Diagnoses  Name Primary?   Sore throat Yes   Fever and chills    Nausea and vomiting, unspecified vomiting type  Plan:     Strep swab negative  Mono spot test negative  Recommendations: Try to get good hydration in the next few days, at least 80 to 100 ounces of water per day You can use some electrolytes such as G2 Gatorade or Pedialyte over-the-counter if needed in the next few days You can use Tylenol or ibuprofen over-the-counter for fever or not feeling well.  You can use ibuprofen every 6 hours for example I prescribe Zofran oral disintegrating tablet that you can use either swallow or dissolve in your mouth.  You can use this every 4-6 hours for nausea Rest for the next 2 days You can use salt water  gargles and warm fluids for sore throat, Chloraseptic spray over-the-counter sore throat You can use over-the-counter allergy pill such as Benadryl or Zyrtec or sinus decongestant for congestion and runny nose If worse or no improvement or if uncontrolled vomiting within the next 48 hours then call back or recheck   Ronald Frey was seen today for sore throat.  Diagnoses and all orders for this visit:  Sore throat -     Rapid Strep A -     Mono (Epstein Barr Virus)  Fever and chills  Nausea and vomiting, unspecified vomiting type  Other orders -     ondansetron (ZOFRAN-ODT) 4 MG disintegrating tablet; Take 1 tablet (4 mg total) by mouth every 8 (eight) hours as needed for nausea or vomiting.    F/u prn

## 2022-12-29 NOTE — Patient Instructions (Signed)
Strep swab negative  Mono spot test negative  Recommendations: Try to get good hydration in the next few days, at least 80 to 100 ounces of water per day You can use some electrolytes such as G2 Gatorade or Pedialyte over-the-counter if needed in the next few days You can use Tylenol or ibuprofen over-the-counter for fever or not feeling well.  You can use ibuprofen every 6 hours for example I prescribe Zofran oral disintegrating tablet that you can use either swallow or dissolve in your mouth.  You can use this every 4-6 hours for nausea Rest for the next 2 days You can use salt water gargles and warm fluids for sore throat, Chloraseptic spray over-the-counter sore throat You can use over-the-counter allergy pill such as Benadryl or Zyrtec or sinus decongestant for congestion and runny nose If worse or no improvement or if uncontrolled vomiting within the next 48 hours then call back or recheck

## 2022-12-31 ENCOUNTER — Encounter: Payer: Self-pay | Admitting: Medical

## 2023-11-29 ENCOUNTER — Ambulatory Visit: Payer: Self-pay | Admitting: *Deleted

## 2023-11-29 ENCOUNTER — Encounter: Payer: Self-pay | Admitting: Nurse Practitioner

## 2023-11-29 ENCOUNTER — Ambulatory Visit: Admitting: Nurse Practitioner

## 2023-11-29 ENCOUNTER — Encounter: Payer: Self-pay | Admitting: Medical

## 2023-11-29 VITALS — BP 112/72 | HR 72 | Temp 98.2°F | Wt 158.2 lb

## 2023-11-29 DIAGNOSIS — A084 Viral intestinal infection, unspecified: Secondary | ICD-10-CM

## 2023-11-29 MED ORDER — PROMETHAZINE HCL 25 MG PO TABS: 25.0000 mg | ORAL_TABLET | Freq: Three times a day (TID) | ORAL | 0 refills | Status: AC | PRN

## 2023-11-29 NOTE — Patient Instructions (Addendum)
 Viral Gastroenteritis, Adult  Viral gastroenteritis is also known as the stomach flu. This condition may affect your stomach, your small intestine, and your large intestine. It can cause sudden watery poop (diarrhea), fever, and vomiting. This condition is caused by certain germs (viruses). These germs can be passed from person to person very easily (are contagious). Having watery poop and vomiting can make you feel weak and cause you to not have enough water in your body (get dehydrated). This can make you tired and thirsty, make you have a dry mouth, and make it so you pee (urinate) less often. It is important to replace the fluids that you lose from having watery poop and vomiting. What are the causes? You can get sick by catching germs from other people. You can also get sick by: Eating food, drinking water, or touching a surface that has the germs on it (is contaminated). Sharing utensils or other personal items with a person who is sick. What increases the risk? Having a weak body defense system (immune system). Living with one or more children who are younger than 2 years. Living in a nursing home. Going on cruise ships. What are the signs or symptoms? Symptoms of this condition start suddenly. Symptoms may last for a few days or for as long as a week. Common symptoms include: Watery poop. Vomiting. Other symptoms include: Fever. Headache. Feeling tired (fatigue). Pain in the belly (abdomen). Chills. Feeling weak. Feeling like you may vomit (nauseous). Muscle aches. Not feeling hungry. How is this treated? This condition typically goes away on its own. The focus of treatment is to replace the fluids that you lose. This condition may be treated with: An ORS (oral rehydration solution). This is a drink that helps you replace fluids and minerals your body lost. It is sold at pharmacies and stores. Medicines to help with your symptoms. Probiotic supplements to reduce symptoms of  watery poop. Fluids given through an IV tube, if needed. Older adults and people with other diseases or a weak body defense system are at higher risk for not having enough water in the body. Follow these instructions at home: Eating and drinking  Take an ORS as told by your doctor. Drink clear fluids in small amounts as you are able. Clear fluids include: Water. Ice chips. Fruit juice that has water added to it (is diluted). Low-calorie sports drinks. Drink enough fluid to keep your pee (urine) pale yellow. Eat small amounts of healthy foods every 3-4 hours as you are able. This may include whole grains, fruits, vegetables, lean meats, and yogurt. Avoid fluids that have a lot of sugar or caffeine in them. This includes energy drinks, sports drinks, and soda. Avoid spicy or fatty foods. Avoid alcohol. General instructions  Wash your hands often. This is very important after you have watery poop or you vomit. If you cannot use soap and water, use hand sanitizer. Make sure that all people in your home wash their hands well and often. Take over-the-counter and prescription medicines only as told by your doctor. Rest at home while you get better. Watch your condition for any changes. Take a warm bath to help with any burning or pain from having watery poop. Keep all follow-up visits. Contact a doctor if: You cannot keep fluids down. Your symptoms get worse. You have new symptoms. You feel light-headed or dizzy. You have muscle cramps. Get help right away if: You have chest pain. You have trouble breathing, or you are breathing very fast.  You have a fast heartbeat. You feel very weak or you faint. You have a very bad headache, a stiff neck, or both. You have a rash. You have very bad pain, cramping, or bloating in your belly. Your skin feels cold and clammy. You feel mixed up (confused). You have pain when you pee. You have signs of not having enough water in the body, such  as: Dark pee, hardly any pee, or no pee. Cracked lips. Dry mouth. Sunken eyes. Feeling very sleepy. Feeling weak. You have signs of bleeding, such as: You see blood in your vomit. Your vomit looks like coffee grounds. You have bloody or black poop or poop that looks like tar. These symptoms may be an emergency. Get help right away. Call 911. Do not wait to see if the symptoms will go away. Do not drive yourself to the hospital. Summary Viral gastroenteritis is also known as the stomach flu. This condition can cause sudden watery poop (diarrhea), fever, and vomiting. These germs can be passed from person to person very easily. Take an ORS (oral rehydration solution) as told by your doctor. This is a drink that is sold at pharmacies and stores. Wash your hands often, especially after having watery poop or vomiting. If you cannot use soap and water, use hand sanitizer. This information is not intended to replace advice given to you by your health care provider. Make sure you discuss any questions you have with your health care provider. Document Revised: 04/13/2021 Document Reviewed: 04/13/2021 Elsevier Patient Education  2024 [employer].

## 2023-11-29 NOTE — Telephone Encounter (Signed)
  Chief Complaint: nausea/vomiting, diarrhea , headache  Symptoms: Patient seen at [facility] yesterday- reports he is not better- most vomiting is occurring am/pm- he believes related to reflux- Zofran  not helping Frequency: symptoms started Saturday  Pertinent Negatives: Patient denies fever, head injury Disposition: [] ED /[] Urgent Care (no appt availability in office) / [] Appointment(In office/virtual)/ []  [place] [facility]/ [] Home Care/ [] Refused Recommended Disposition /[] [facility]/ [x]  Follow-up with PCP Additional Notes: No open appointment in office- message sent for possible work-in appointment    Copied from CRM [id number]. Topic: Clinical - Red Word Triage >> Nov 29, 2023  8:27 AM Oddis Bench wrote: Red Word that prompted transfer to Nurse Triage: Patient is calling he has alot of vomiting, mucus yellow and green, with diahrea. He was seen in urgent care on yesterday and he feels worse. Reason for Disposition  [1] MILD or MODERATE vomiting AND [2] present > 48 hours (2 days) (Exception: Mild vomiting with associated diarrhea.)  Answer Assessment - Initial Assessment Questions 1. VOMITING SEVERITY: "How many times have you vomited in the past 24 hours?"     - MILD:  1 - 2 times/day    - MODERATE: 3 - 5 times/day, decreased oral intake without significant weight loss or symptoms of dehydration    - SEVERE: 6 or more times/day, vomits everything or nearly everything, with significant weight loss, symptoms of dehydration      severe 2. ONSET: "When did the vomiting begin?"      Saturday 3. FLUIDS: "What fluids or food have you vomited up today?" "Have you been able to keep any fluids down?"     Able to keep fluids and light solids, am/late afternoon is worst time 4. ABDOMEN PAIN: "Are your having any abdomen pain?" If Yes : "How bad is it and what does it feel like?" (e.g., crampy, dull, intermittent, constant)      Only pre bowel movement 5. DIARRHEA: "Is there any diarrhea?"  If Yes, ask: "How many times today?"      Started with the vomiting-twice, watery 6. 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 CAUSE: "What do you think is causing your vomiting?"     Viral cause 8. HYDRATION STATUS: "Any signs of dehydration?" (e.g., dry mouth [not only dry lips], too weak to stand) "When did you last urinate?"     No, urinated 30 minute 9. OTHER SYMPTOMS: "Do you have any other symptoms?" (e.g., fever, headache, vertigo, vomiting blood or coffee grounds, recent head injury)     Headache  Protocols used: Vomiting-A-AH

## 2023-11-29 NOTE — Progress Notes (Signed)
 Annella Kief, DNP, AGNP-c [facility] [address] [place], [place] [address] [phone]   ACUTE VISIT- ESTABLISHED PATIENT  Blood pressure 112/72, pulse 72, temperature 98.2 F (36.8 C), weight 158 lb 3.2 oz (71.8 kg), SpO2 98%.  Subjective:  HPI History of Present Illness Ronald Frey "Ronald Frey" is a 23 year old male who presents with persistent vomiting and diarrhea.  He has been experiencing persistent vomiting and diarrhea since Saturday. The vomiting is mucousy, and he is able to tolerate light foods such as bread and crackers, though he is trying to limit his intake. No fever or recent illness in his contacts, despite working at the [employer] where he is exposed to many people. He lives with someone who has not shown any symptoms.  He does not recall consuming any food outside the home that could have caused his symptoms and mentions that this does not resemble previous food-related illnesses he has experienced.   He has been using Zofran  prescribed by urgent care to manage his symptoms. No blood in his stool, but he describes his urine as darker than usual.  He is currently urinating, and his diarrhea is less bothersome than the vomiting.  He is interested in options that will help with his frequent emesis.   ROS negative except for what is listed in HPI. History, Medications, Surgery, SDOH, and Family History reviewed and updated as appropriate.  Objective:  Physical Exam Vitals and nursing note reviewed.  Constitutional:      Appearance: He is ill-appearing. He is not toxic-appearing or diaphoretic.  HENT:     Head: Normocephalic.  Eyes:     Conjunctiva/sclera: Conjunctivae normal.  Cardiovascular:     Rate and Rhythm: Normal rate and regular rhythm.     Pulses: Normal pulses.     Heart sounds: Normal heart sounds.  Pulmonary:     Effort: Pulmonary effort is normal.     Breath sounds: Normal breath sounds.  Abdominal:     General:  Abdomen is flat. Bowel sounds are normal. There is no distension.     Palpations: Abdomen is soft.     Tenderness: There is no abdominal tenderness. There is no guarding.  Musculoskeletal:     Right lower leg: No edema.     Left lower leg: No edema.  Skin:    General: Skin is warm and dry.     Capillary Refill: Capillary refill takes less than 2 seconds.  Neurological:     Mental Status: He is alert and oriented to person, place, and time.     Motor: Weakness present.  Psychiatric:        Mood and Affect: Mood normal.         Assessment & Plan:   Problem List Items Addressed This Visit     Viral gastroenteritis - Primary   Acute gastroenteritis with vomiting and diarrhea for four days, no fever, mucousy vomiting likely due to mucus and stomach acid, no blood in stool, able to tolerate light foods, dehydration risk indicated by dark urine, no abdominal pain, likely viral etiology. - Prescribe Phenergan  for nausea and vomiting; start with a whole pill, consider half if nausea persists. Contact clinic for suppository if unable to retain oral medication. - Advise rest and sleep to aid recovery. - Recommend sips of ice chips and small amounts of clear liquids like sugar-free Gatorade to maintain hydration, gradually increase fluid intake if tolerated. - Instruct to avoid solid foods until vomiting subsides. - Contact clinic  if unable to retain medication or if symptoms persist into next week.      Relevant Medications   promethazine  (PHENERGAN ) 25 MG tablet      Annella Kief, DNP, AGNP-c

## 2023-11-29 NOTE — Telephone Encounter (Signed)
 Called patient and got him scheduled with SB today @ 2:15pm.

## 2023-11-29 NOTE — Assessment & Plan Note (Signed)
 Acute gastroenteritis with vomiting and diarrhea for four days, no fever, mucousy vomiting likely due to mucus and stomach acid, no blood in stool, able to tolerate light foods, dehydration risk indicated by dark urine, no abdominal pain, likely viral etiology. - Prescribe Phenergan  for nausea and vomiting; start with a whole pill, consider half if nausea persists. Contact clinic for suppository if unable to retain oral medication. - Advise rest and sleep to aid recovery. - Recommend sips of ice chips and small amounts of clear liquids like sugar-free Gatorade to maintain hydration, gradually increase fluid intake if tolerated. - Instruct to avoid solid foods until vomiting subsides. - Contact clinic if unable to retain medication or if symptoms persist into next week.

## 2024-01-10 ENCOUNTER — Emergency Department (HOSPITAL_COMMUNITY)
Admission: EM | Admit: 2024-01-10 | Discharge: 2024-01-10 | Discharge status: Against Medical Advice | Source: Ambulatory Visit | Attending: Physician Assistant | Admitting: Physician Assistant

## 2024-01-10 ENCOUNTER — Other Ambulatory Visit: Payer: Self-pay

## 2024-01-10 ENCOUNTER — Encounter (HOSPITAL_COMMUNITY): Payer: Self-pay | Admitting: Pharmacy Technician

## 2024-01-10 ENCOUNTER — Ambulatory Visit: Payer: Self-pay

## 2024-01-10 DIAGNOSIS — R112 Nausea with vomiting, unspecified: Secondary | ICD-10-CM | POA: Diagnosis present

## 2024-01-10 DIAGNOSIS — Z5321 Procedure and treatment not carried out due to patient leaving prior to being seen by health care provider: Secondary | ICD-10-CM | POA: Insufficient documentation

## 2024-01-10 DIAGNOSIS — R109 Unspecified abdominal pain: Secondary | ICD-10-CM | POA: Insufficient documentation

## 2024-01-10 DIAGNOSIS — R197 Diarrhea, unspecified: Secondary | ICD-10-CM | POA: Insufficient documentation

## 2024-01-10 LAB — CBC WITH DIFFERENTIAL/PLATELET
Abs Immature Granulocytes: 0.02 K/uL (ref 0.00–0.07)
Basophils Absolute: 0.1 K/uL (ref 0.0–0.1)
Basophils Relative: 1 %
Eosinophils Absolute: 0.2 K/uL (ref 0.0–0.5)
Eosinophils Relative: 2 %
HCT: 40.3 % (ref 39.0–52.0)
Hemoglobin: 13.8 g/dL (ref 13.0–17.0)
Immature Granulocytes: 0 %
Lymphocytes Relative: 30 %
Lymphs Abs: 2.1 K/uL (ref 0.7–4.0)
MCH: 31.4 pg (ref 26.0–34.0)
MCHC: 34.2 g/dL (ref 30.0–36.0)
MCV: 91.8 fL (ref 80.0–100.0)
Monocytes Absolute: 0.5 K/uL (ref 0.1–1.0)
Monocytes Relative: 7 %
Neutro Abs: 4.4 K/uL (ref 1.7–7.7)
Neutrophils Relative %: 60 %
Platelets: 331 K/uL (ref 150–400)
RBC: 4.39 MIL/uL (ref 4.22–5.81)
RDW: 11.5 % (ref 11.5–15.5)
WBC: 7.2 K/uL (ref 4.0–10.5)
nRBC: 0 % (ref 0.0–0.2)

## 2024-01-10 LAB — COMPREHENSIVE METABOLIC PANEL WITH GFR
ALT: 18 U/L (ref 0–44)
AST: 19 U/L (ref 15–41)
Albumin: 4.1 g/dL (ref 3.5–5.0)
Alkaline Phosphatase: 57 U/L (ref 38–126)
Anion gap: 9 (ref 5–15)
BUN: 13 mg/dL (ref 6–20)
CO2: 27 mmol/L (ref 22–32)
Calcium: 9.5 mg/dL (ref 8.9–10.3)
Chloride: 104 mmol/L (ref 98–111)
Creatinine, Ser: 0.84 mg/dL (ref 0.61–1.24)
GFR, Estimated: >60 mL/min (ref >60)
Glucose, Bld: 104 mg/dL — ABNORMAL HIGH (ref 70–99)
Potassium: 4.1 mmol/L (ref 3.5–5.1)
Sodium: 140 mmol/L (ref 135–145)
Total Bilirubin: 1.1 mg/dL (ref 0.0–1.2)
Total Protein: 6.8 g/dL (ref 6.5–8.1)

## 2024-01-10 LAB — URINALYSIS, ROUTINE W REFLEX MICROSCOPIC
Bilirubin Urine: NEGATIVE
Glucose, UA: NEGATIVE mg/dL
Hgb urine dipstick: NEGATIVE
Ketones, ur: NEGATIVE mg/dL
Leukocytes,Ua: NEGATIVE
Nitrite: NEGATIVE
Protein, ur: NEGATIVE mg/dL
Specific Gravity, Urine: 1.025 (ref 1.005–1.030)
pH: 6 (ref 5.0–8.0)

## 2024-01-10 LAB — LIPASE, BLOOD: Lipase: 37 U/L (ref 11–51)

## 2024-01-10 NOTE — Telephone Encounter (Signed)
 FYI Only or Action Required?: Action required by provider: referral request, clinical question for provider, and update on patient condition.  Patient was last seen in primary care on 11/29/2023 by Early, Camie BRAVO, NP.  Called Nurse Triage reporting Vomiting.  Symptoms began yesterday.  Interventions attempted: Rest, hydration, or home remedies and Dietary changes.  Symptoms are: gradually worsening.  Triage Disposition: See Physician Within 24 Hours  Patient/caregiver understands and will follow disposition?: No, wishes to speak with PCP                        Vomiting and stomach pains - has questions. Would like to speak to nurse.   Reason for Disposition  [1] MILD or MODERATE vomiting AND [2] present > 48 hours (2 days)  (Exception: Mild vomiting with associated diarrhea.)  Answer Assessment - Initial Assessment Questions Started yesterday Abd pain at work, ate crackers and gatorade and then 20-30 min later patient started vomiting Patient states that this has been an off and on issue for a while and he would like his PCP's advice on possibly having a referral to a G.I. Specialist Patient is currently at work today Patient is advised to call back with any questions or if he changes his mind about an appointment, go to Urgent Care if needed, and if anything gets worse to go to the Emergency Room. Patient verbalized understanding.   1. VOMITING SEVERITY: How many times have you vomited in the past 24 hours?      10-12 times   4 times today alone 2. ONSET: When did the vomiting begin?      Started yesterday at work 3. FLUIDS: What fluids or food have you vomited up today? Have you been able to keep any fluids down?     Unable to keep anything down at all 4. ABDOMEN PAIN: Are your having any abdomen pain? If Yes : How bad is it and what does it feel like? (e.g., crampy, dull, intermittent, constant)      Yes--patient states soreness from  vomiting 5. DIARRHEA: Is there any diarrhea? If Yes, ask: How many times today?      Nothing unusual 6. CONTACTS: Is there anyone else in the family with the same symptoms?      A coworker stated that he wasn't feeling good 7. CAUSE: What do you think is causing your vomiting?     Unsure exactly 8. HYDRATION STATUS: Any signs of dehydration? (e.g., dry mouth [not only dry lips], too weak to stand) When did you last urinate?     Patient states he doesn't feel dehydrated 9. OTHER SYMPTOMS: Do you have any other symptoms? (e.g., fever, headache, vertigo, vomiting blood or coffee grounds, recent head injury)     headache  Protocols used: Vomiting-A-AH

## 2024-01-10 NOTE — ED Provider Triage Note (Signed)
 Emergency Medicine Provider Triage Evaluation Note  Ronald Frey , a 23 y.o. male  was evaluated in triage.  Pt complains of abdominal pain which has been ongoing since yesterday.  Prior history of gastroenteritis.  Does endorse a history of daily use of wax ( which is pressed down marijuana). Vomited x 10 episodes, and 4x episodes of diarrhea. No fevers  Review of Systems  Positive: Abdominal pain, nausea, vomiting, diarrhea Negative: Fever, urinary symptoms  Physical Exam  BP 126/81 (BP Location: Left Leg)   Pulse (!) 51   Temp 98.5 F (36.9 C)   Resp 18   SpO2 100%  Gen:   Awake, no distress  Resp:  Normal effort  MSK:   Moves extremities without difficulty  Other:    Medical Decision Making  Medically screening exam initiated at 1:08 PM.  Appropriate orders placed.  Alexis Mizuno was informed that the remainder of the evaluation will be completed by another provider, this initial triage assessment does not replace that evaluation, and the importance of remaining in the ED until their evaluation is complete.     Annia Gomm, PA-C 01/10/24 1311

## 2024-01-10 NOTE — ED Triage Notes (Signed)
 Pt here from home with c/o n/v and abd pain since yesterday morning , some of his co workers have been sick

## 2024-01-10 NOTE — Telephone Encounter (Signed)
 Called patient, got disconnected. Called again and left message that patient will need to be evaluated first before able to give a referral, and to call back to schedule a visit
